# Patient Record
Sex: Male | Born: 1980 | Race: White | Hispanic: No | Marital: Married | Smoking: Never smoker
Health system: Southern US, Community
[De-identification: ages and names within clinical notes are randomized; demographics above are authoritative.]

---

## 2019-12-22 ENCOUNTER — Ambulatory Visit: Payer: Self-pay | Attending: Internal Medicine

## 2019-12-22 DIAGNOSIS — Z23 Encounter for immunization: Secondary | ICD-10-CM

## 2019-12-22 NOTE — Progress Notes (Signed)
   Covid-19 Vaccination Clinic  Name:  Rick Lawson    MRN: [id number] DOB: April 25, 1981  12/22/2019  [name] was observed post Covid-19 immunization for 15 minutes without incident. He was provided with Vaccine Information Sheet and instruction to access the V-Safe system.   [name] was instructed to call 911 with any severe reactions post vaccine: Marland Kitchen Difficulty breathing  . Swelling of face and throat  . A fast heartbeat  . A bad rash all over body  . Dizziness and weakness   Immunizations Administered    Name Date Dose VIS Date Route   Pfizer COVID-19 Vaccine 12/22/2019  4:26 PM 0.3 mL 09/22/2019 Intramuscular   Manufacturer: [employer], [employer]   Lot: [id number]   NDC: [address]-5

## 2020-01-16 ENCOUNTER — Ambulatory Visit: Payer: Self-pay | Attending: Internal Medicine

## 2020-01-16 DIAGNOSIS — Z23 Encounter for immunization: Secondary | ICD-10-CM

## 2020-01-16 NOTE — Progress Notes (Signed)
   Covid-19 Vaccination Clinic  Name:  Rick Lawson    MRN: [id number] DOB: 09-15-1981  01/16/2020  [name] was observed post Covid-19 immunization for 15 minutes without incident. He was provided with Vaccine Information Sheet and instruction to access the V-Safe system.   [name] was instructed to call 911 with any severe reactions post vaccine: Marland Kitchen Difficulty breathing  . Swelling of face and throat  . A fast heartbeat  . A bad rash all over body  . Dizziness and weakness   Immunizations Administered    Name Date Dose VIS Date Route   Pfizer COVID-19 Vaccine 01/16/2020  9:34 AM 0.3 mL 09/22/2019 Intramuscular   Manufacturer: [employer], [employer]   Lot: [id number]   NDC: [address]-0

## 2020-02-26 ENCOUNTER — Other Ambulatory Visit: Payer: Self-pay

## 2020-02-26 ENCOUNTER — Ambulatory Visit (HOSPITAL_COMMUNITY)
Admission: EM | Admit: 2020-02-26 | Discharge: 2020-02-26 | Disposition: A | Discharge status: Home / Self Care | Payer: PRIVATE HEALTH INSURANCE | Attending: Urgent Care | Admitting: Urgent Care

## 2020-02-26 ENCOUNTER — Encounter (HOSPITAL_COMMUNITY): Payer: Self-pay

## 2020-02-26 DIAGNOSIS — Z79899 Other long term (current) drug therapy: Secondary | ICD-10-CM | POA: Insufficient documentation

## 2020-02-26 DIAGNOSIS — R5383 Other fatigue: Secondary | ICD-10-CM | POA: Insufficient documentation

## 2020-02-26 DIAGNOSIS — R070 Pain in throat: Secondary | ICD-10-CM

## 2020-02-26 DIAGNOSIS — R5381 Other malaise: Secondary | ICD-10-CM | POA: Insufficient documentation

## 2020-02-26 DIAGNOSIS — R000 Tachycardia, unspecified: Secondary | ICD-10-CM | POA: Diagnosis not present

## 2020-02-26 DIAGNOSIS — B349 Viral infection, unspecified: Secondary | ICD-10-CM

## 2020-02-26 DIAGNOSIS — Z20822 Contact with and (suspected) exposure to covid-19: Secondary | ICD-10-CM | POA: Insufficient documentation

## 2020-02-26 DIAGNOSIS — R05 Cough: Secondary | ICD-10-CM | POA: Diagnosis not present

## 2020-02-26 DIAGNOSIS — R059 Cough, unspecified: Secondary | ICD-10-CM

## 2020-02-26 DIAGNOSIS — R509 Fever, unspecified: Secondary | ICD-10-CM | POA: Insufficient documentation

## 2020-02-26 LAB — POCT RAPID STREP A: Streptococcus, Group A Screen (Direct): NEGATIVE

## 2020-02-26 MED ORDER — PROMETHAZINE-DM 6.25-15 MG/5ML PO SYRP
5.0000 mL | ORAL_SOLUTION | Freq: Every evening | ORAL | 0 refills | Status: DC | PRN
Start: 2020-02-26 — End: 2022-03-06

## 2020-02-26 MED ORDER — CETIRIZINE HCL 10 MG PO TABS: 10.0000 mg | ORAL_TABLET | Freq: Every day | ORAL | 0 refills | Status: DC

## 2020-02-26 MED ORDER — ACETAMINOPHEN 325 MG PO TABS
650.0000 mg | ORAL_TABLET | Freq: Once | ORAL | Status: AC
  Administered 2020-02-26: 650 mg via ORAL

## 2020-02-26 MED ORDER — BENZONATATE 100 MG PO CAPS: 100.0000 mg | ORAL_CAPSULE | Freq: Three times a day (TID) | ORAL | 0 refills | Status: DC | PRN

## 2020-02-26 MED ORDER — ACETAMINOPHEN 325 MG PO TABS
ORAL_TABLET | ORAL | Status: AC
  Filled 2020-02-26: qty 2

## 2020-02-26 NOTE — Discharge Instructions (Signed)

## 2020-02-26 NOTE — ED Triage Notes (Signed)
Pt c/o nasal congestion, sore throat,  onset Friday, fever onset yesterday with Tmax 100.5 (orally). Last dose dayquil early this morning.  Single episode of diarrhea yesterday.  Denies abdom pain, n/v.   Had second COVID vaccine in April

## 2020-02-26 NOTE — ED Provider Notes (Addendum)
[place]   MRN: [id number] DOB: 1981-04-07  Subjective:   Rick Lawson is a 39 y.o. male presenting for 3-day history of cute onset and worsening malaise.  Worse symptom is throat pain.  But he is also had stuffy nose, cough and occasional intermittent shortness of breath, fatigue and fever.  He had a sick contact with his son with very similar symptoms that got better.  Has used DayQuil and Tylenol with minimal relief.  Patient completed Covid vaccination last month.  No current facility-administered medications for this encounter.  Current Outpatient Medications:  .  omeprazole (PRILOSEC) 20 MG capsule, Take by mouth., Disp: , Rfl:  .  cetirizine (ZYRTEC) 10 MG tablet, Take by mouth., Disp: , Rfl:  .  fluticasone (FLONASE) 50 MCG/ACT nasal spray, Place into the nose., Disp: , Rfl:    No Known Allergies  History reviewed. No pertinent past medical history.   History reviewed. No pertinent surgical history.  Family History  Problem Relation Age of Onset  . Healthy Mother   . Healthy Father     Social History   Tobacco Use  . Smoking status: Never Smoker  . Smokeless tobacco: Never Used  Substance Use Topics  . Alcohol use: Yes  . Drug use: Never    ROS   Objective:   Vitals: BP 113/85 (BP Location: Left Arm)   Pulse (!) 122   Temp 100.3 F (37.9 C) (Oral)   Resp 20   SpO2 98%   Pulse remained 120-124 on recheck by PA-Dontarious Schaum.   Physical Exam Constitutional:      General: He is not in acute distress.    Appearance: Normal appearance. He is well-developed. He is not ill-appearing, toxic-appearing or diaphoretic.  HENT:     Head: Normocephalic and atraumatic.     Right Ear: External ear normal.     Left Ear: External ear normal.     Nose: Nose normal.     Mouth/Throat:     Mouth: Mucous membranes are moist.     Pharynx: Oropharynx is clear. Posterior oropharyngeal erythema and uvula swelling present.  Eyes:     General: No scleral  icterus.    Extraocular Movements: Extraocular movements intact.     Pupils: Pupils are equal, round, and reactive to light.  Cardiovascular:     Rate and Rhythm: Regular rhythm. Tachycardia present.     Heart sounds: Normal heart sounds. No murmur. No friction rub. No gallop.   Pulmonary:     Effort: Pulmonary effort is normal. No respiratory distress.     Breath sounds: Normal breath sounds. No stridor. No wheezing, rhonchi or rales.  Neurological:     Mental Status: He is alert and oriented to person, place, and time.  Psychiatric:        Mood and Affect: Mood normal.        Behavior: Behavior normal.        Thought Content: Thought content normal.        Judgment: Judgment normal.     Results for orders placed or performed during the hospital encounter of 02/26/20 (from the past 24 hour(s))  POCT rapid strep A [facility])     Status: None   Collection Time: 02/26/20  7:47 PM  Result Value Ref Range   Streptococcus, Group A Screen (Direct) NEGATIVE NEGATIVE   ED ECG REPORT   Date: 02/26/2020  Rate: 116bpm  Rhythm: sinus tachycardia  QRS Axis: normal  Intervals: normal  ST/T  Wave abnormalities: nonspecific T wave changes  Conduction Disutrbances:right bundle branch block  Narrative Interpretation: Sinus tachycardia at 116 bpm, possible incomplete right bundle branch block, T wave inversion in lead III.  No previous EKG for comparison.  Old EKG Reviewed: none available  I have personally reviewed the EKG tracing and agree with the computerized printout as noted.   Assessment and Plan :   PDMP not reviewed this encounter.  1. Fever, unspecified   2. Throat pain   3. Cough   4. Malaise and fatigue     Suspect tachycardia is related to fever but have a high suspicion for COVID-19 given symptoms set.  Will manage for viral illness such as viral URI, viral syndrome, viral rhinitis, viral pharyngitis, COVID-19. Counseled patient on nature of COVID-19 including modes  of transmission, diagnostic testing, management and supportive care.  Offered symptomatic relief. COVID 19 testing is pending. Counseled patient on potential for adverse effects with medications prescribed/recommended today, strict ER and return-to-clinic precautions discussed, patient verbalized understanding.      Wallis Bamberg, PA-C 02/26/20 2013

## 2020-02-27 LAB — SARS CORONAVIRUS 2 (TAT 6-24 HRS): SARS Coronavirus 2: NEGATIVE

## 2020-02-28 LAB — CULTURE, GROUP A STREP (THRC)

## 2022-03-06 ENCOUNTER — Other Ambulatory Visit: Payer: Self-pay

## 2022-03-06 ENCOUNTER — Emergency Department (HOSPITAL_BASED_OUTPATIENT_CLINIC_OR_DEPARTMENT_OTHER)
Admission: EM | Admit: 2022-03-06 | Discharge: 2022-03-06 | Disposition: A | Discharge status: Home / Self Care | Payer: 59 | Attending: Emergency Medicine | Admitting: Emergency Medicine

## 2022-03-06 ENCOUNTER — Emergency Department (HOSPITAL_BASED_OUTPATIENT_CLINIC_OR_DEPARTMENT_OTHER): Payer: 59

## 2022-03-06 ENCOUNTER — Encounter (HOSPITAL_BASED_OUTPATIENT_CLINIC_OR_DEPARTMENT_OTHER): Payer: Self-pay

## 2022-03-06 DIAGNOSIS — N201 Calculus of ureter: Secondary | ICD-10-CM | POA: Insufficient documentation

## 2022-03-06 DIAGNOSIS — R1032 Left lower quadrant pain: Secondary | ICD-10-CM | POA: Diagnosis present

## 2022-03-06 LAB — CBC WITH DIFFERENTIAL/PLATELET
Abs Immature Granulocytes: 0.02 10*3/uL (ref 0.00–0.07)
Basophils Absolute: 0 10*3/uL (ref 0.0–0.1)
Basophils Relative: 1 %
Eosinophils Absolute: 0.2 10*3/uL (ref 0.0–0.5)
Eosinophils Relative: 3 %
HCT: 42.8 % (ref 39.0–52.0)
Hemoglobin: 15.1 g/dL (ref 13.0–17.0)
Immature Granulocytes: 0 %
Lymphocytes Relative: 48 %
Lymphs Abs: 3.4 10*3/uL (ref 0.7–4.0)
MCH: 31.3 pg (ref 26.0–34.0)
MCHC: 35.3 g/dL (ref 30.0–36.0)
MCV: 88.8 fL (ref 80.0–100.0)
Monocytes Absolute: 0.6 10*3/uL (ref 0.1–1.0)
Monocytes Relative: 8 %
Neutro Abs: 2.8 10*3/uL (ref 1.7–7.7)
Neutrophils Relative %: 40 %
Platelets: 212 10*3/uL (ref 150–400)
RBC: 4.82 MIL/uL (ref 4.22–5.81)
RDW: 12.3 % (ref 11.5–15.5)
WBC: 7.1 10*3/uL (ref 4.0–10.5)
nRBC: 0 % (ref 0.0–0.2)

## 2022-03-06 LAB — URINALYSIS, ROUTINE W REFLEX MICROSCOPIC
Bilirubin Urine: NEGATIVE
Glucose, UA: NEGATIVE mg/dL
Hgb urine dipstick: NEGATIVE
Ketones, ur: NEGATIVE mg/dL
Leukocytes,Ua: NEGATIVE
Nitrite: NEGATIVE
Protein, ur: 30 mg/dL — AB
Specific Gravity, Urine: 1.031 — ABNORMAL HIGH (ref 1.005–1.030)
pH: 5 (ref 5.0–8.0)

## 2022-03-06 LAB — BASIC METABOLIC PANEL
Anion gap: 8 (ref 5–15)
BUN: 24 mg/dL — ABNORMAL HIGH (ref 6–20)
CO2: 29 mmol/L (ref 22–32)
Calcium: 9.1 mg/dL (ref 8.9–10.3)
Chloride: 105 mmol/L (ref 98–111)
Creatinine, Ser: 1.17 mg/dL (ref 0.61–1.24)
GFR, Estimated: >60 mL/min (ref >60)
Glucose, Bld: 116 mg/dL — ABNORMAL HIGH (ref 70–99)
Potassium: 3.8 mmol/L (ref 3.5–5.1)
Sodium: 142 mmol/L (ref 135–145)

## 2022-03-06 MED ORDER — ONDANSETRON HCL 4 MG/2ML IJ SOLN
4.0000 mg | Freq: Once | INTRAMUSCULAR | Status: AC
  Administered 2022-03-06: 4 mg via INTRAVENOUS
  Filled 2022-03-06: qty 2

## 2022-03-06 MED ORDER — FENTANYL CITRATE PF 50 MCG/ML IJ SOSY
50.0000 ug | PREFILLED_SYRINGE | Freq: Once | INTRAMUSCULAR | Status: AC
  Administered 2022-03-06: 50 ug via INTRAVENOUS
  Filled 2022-03-06: qty 1

## 2022-03-06 MED ORDER — KETOROLAC TROMETHAMINE 30 MG/ML IJ SOLN
30.0000 mg | Freq: Once | INTRAMUSCULAR | Status: AC
  Administered 2022-03-06: 30 mg via INTRAVENOUS
  Filled 2022-03-06: qty 1

## 2022-03-06 MED ORDER — TAMSULOSIN HCL 0.4 MG PO CAPS: 0.4000 mg | ORAL_CAPSULE | Freq: Every day | ORAL | 0 refills | Status: AC

## 2022-03-06 MED ORDER — SODIUM CHLORIDE 0.9 % IV BOLUS
1000.0000 mL | Freq: Once | INTRAVENOUS | Status: AC
  Administered 2022-03-06: 1000 mL via INTRAVENOUS

## 2022-03-06 MED ORDER — IBUPROFEN 800 MG PO TABS: 800.0000 mg | ORAL_TABLET | Freq: Three times a day (TID) | ORAL | 0 refills | Status: AC | PRN

## 2022-03-06 NOTE — ED Triage Notes (Signed)
Reports left flank pain that has moved to the groin area beginning about 1hr prior to arrival. Urinated and had a tingling sensation with pressure afterwards.

## 2022-03-06 NOTE — ED Provider Notes (Signed)
[facility] EMERGENCY DEPT  Provider Note  CSN: [id number] Arrival date & time: 03/06/22 0117  History Chief Complaint  Patient presents with   Groin Pain    Rick Lawson is a 41 y.o. male reports he was woken up from sleep about an hour prior to arrival with a pain in his urethra, he went to urinate but pain did not go away. He began having a dull aching pain in his L groin area. Some in L flank. Not associated with nausea or diaphoresis. No blood in urine. No fevers. No prior history of same.    Home Medications Prior to Admission medications   Medication Sig Start Date End Date Taking? Authorizing Provider  ibuprofen (ADVIL) 800 MG tablet Take 1 tablet (800 mg total) by mouth every 8 (eight) hours as needed. 03/06/22  Yes Pollyann Savoy, MD  tamsulosin (FLOMAX) 0.4 MG CAPS capsule Take 1 capsule (0.4 mg total) by mouth daily for 14 days. 03/06/22 03/20/22 Yes Pollyann Savoy, MD     Allergies    Dog epithelium allergy skin test, Grass pollen([id number]-o-r-t-swt vern), Other, and Pollen extract   Review of Systems   Review of Systems Please see HPI for pertinent positives and negatives  Physical Exam BP 138/80   Pulse 76   Temp 98 F (36.7 C)   Resp 19   Ht 5\' 10"  (1.778 m)   Wt 79.4 kg   SpO2 100%   BMI 25.11 kg/m   Physical Exam Vitals and nursing note reviewed.  Constitutional:      Appearance: Normal appearance.  HENT:     Head: Normocephalic and atraumatic.     Nose: Nose normal.     Mouth/Throat:     Mouth: Mucous membranes are moist.  Eyes:     Extraocular Movements: Extraocular movements intact.     Conjunctiva/sclera: Conjunctivae normal.  Cardiovascular:     Rate and Rhythm: Normal rate.  Pulmonary:     Effort: Pulmonary effort is normal.     Breath sounds: Normal breath sounds.  Abdominal:     General: Abdomen is flat.     Palpations: Abdomen is soft.     Tenderness: There is abdominal tenderness (LLQ). There is no  guarding.  Musculoskeletal:        General: No swelling. Normal range of motion.     Cervical back: Neck supple.  Skin:    General: Skin is warm and dry.  Neurological:     General: No focal deficit present.     Mental Status: He is alert.  Psychiatric:        Mood and Affect: Mood normal.    ED Results / Procedures / Treatments   EKG None  Procedures Procedures  Medications Ordered in the ED Medications  fentaNYL (SUBLIMAZE) injection 50 mcg (50 mcg Intravenous Given 03/06/22 0150)  ondansetron (ZOFRAN) injection 4 mg (4 mg Intravenous Given 03/06/22 0150)  sodium chloride 0.9 % bolus 1,000 mL (1,000 mLs Intravenous New Bag/Given 03/06/22 0151)  ketorolac (TORADOL) 30 MG/ML injection 30 mg (30 mg Intravenous Given 03/06/22 0234)    Initial Impression and Plan  Patient here with LLQ/L groin pain radiating into urethra, most likely a renal stone. Could also be diverticulitis, UTI, pyelo, prostatitis. Will check labs, send for CT. Pain/nausea meds and fluids for comfort.   ED Course   Clinical Course as of 03/06/22 0327  Fri Mar 06, 2022  0144 UA is unremarkable.  [CS]  0205 CBC is  normal.  [CS]  0219 I personally viewed the images from radiology studies and agree with radiologist interpretation: CT shows a small distal L ureteral stone as the cause of his symptoms.   [CS]  0226 Discussed CT results with patient and wife now at bedside. He is still having significant pain. Will give a dose of toradol and reassess while waiting for BMP.  [CS]  0228 BMP is unremarkable.  [CS]  [id number] Patient reports pain has improved, he is resting comfortably and ready to go home. Plan discharge with Rx for Motrin and Flomax, expect that he will pass the stone without issue. RTED for any other concerns. Discussed dietary changes to prevent future stones.  [CS]    Clinical Course User Index [CS] Pollyann Savoy, MD     MDM Rules/Calculators/A&P Medical Decision Making Problems  Addressed: Ureteral stone: acute illness or injury  Amount and/or Complexity of Data Reviewed Labs: ordered. Decision-making details documented in ED Course. Radiology: ordered and independent interpretation performed. Decision-making details documented in ED Course.  Risk Prescription drug management. Parenteral controlled substances.    Final Clinical Impression(s) / ED Diagnoses Final diagnoses:  Ureteral stone    Rx / DC Orders ED Discharge Orders          Ordered    ibuprofen (ADVIL) 800 MG tablet  Every 8 hours PRN        03/06/22 0327    tamsulosin (FLOMAX) 0.4 MG CAPS capsule  Daily        03/06/22 0327             Pollyann Savoy, MD 03/06/22 [phone]

## 2022-07-21 IMAGING — CT CT RENAL STONE PROTOCOL
2 of 4 series · 16 of 46 positions shown, 18 images · non-contrast
Comparison: None Available.

CLINICAL DATA: Left flank pain



[Series 2: stone full · axial · 0.75mm/px · z∈[-247,+233]mm · 13 of 106 slices shown, 15 images]
[im 5/106  soft-tissue]
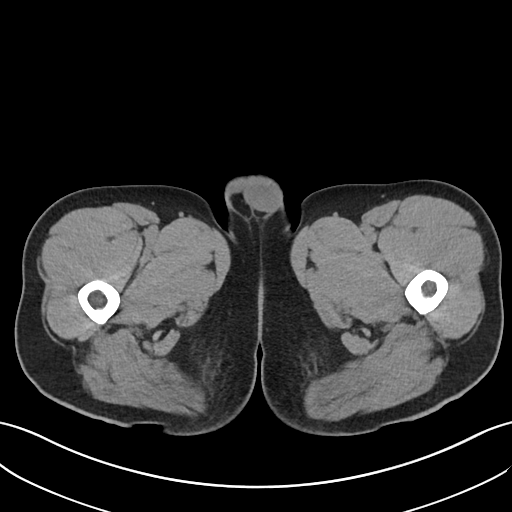
[im 5/106  bone]
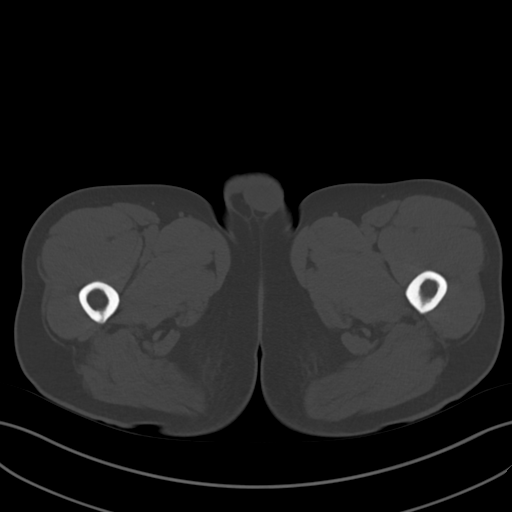
[im 13/106  soft-tissue]
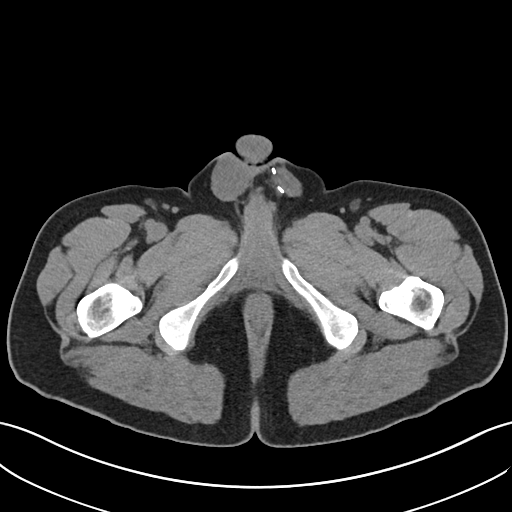
[im 22/106  soft-tissue]
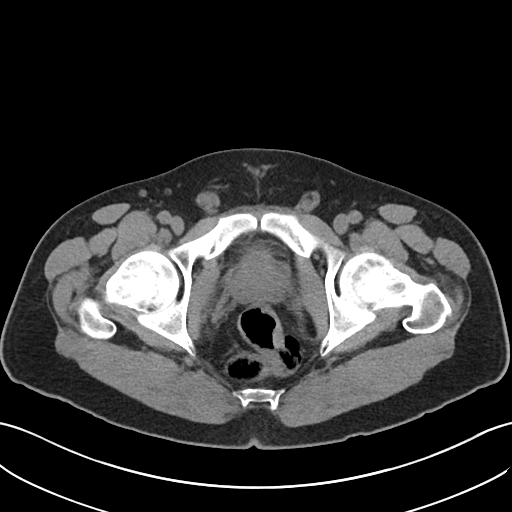
[im 30/106  soft-tissue]
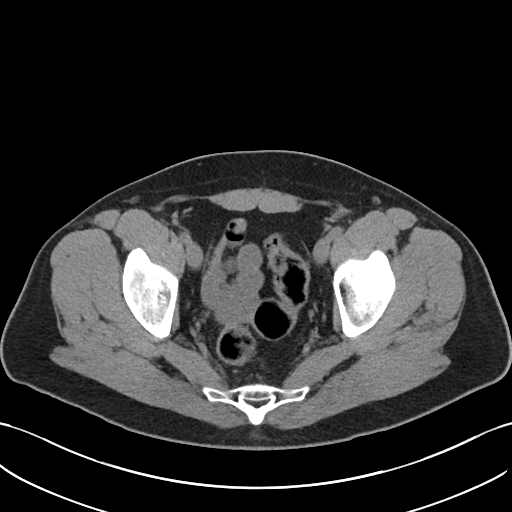
[im 38/106  soft-tissue]
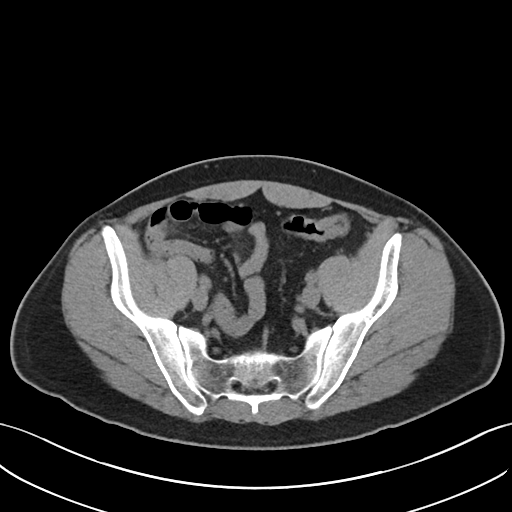
[im 47/106  soft-tissue]
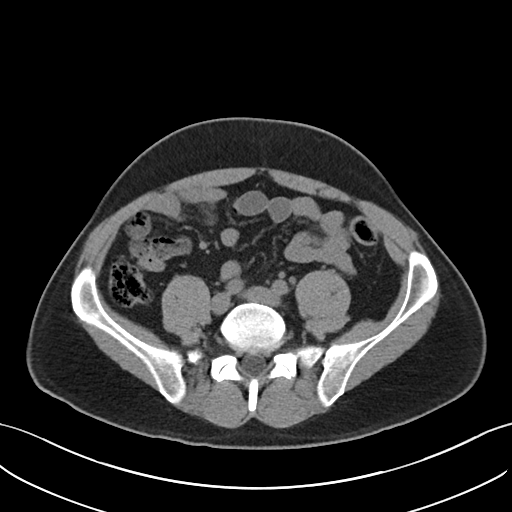
[im 55/106  soft-tissue]
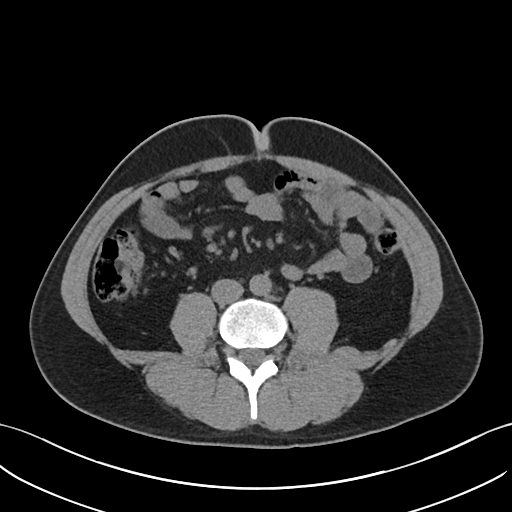
[im 59/106  soft-tissue]
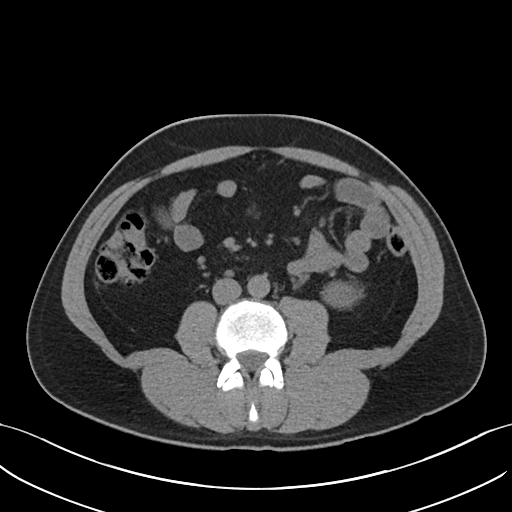
[im 68/106  soft-tissue]
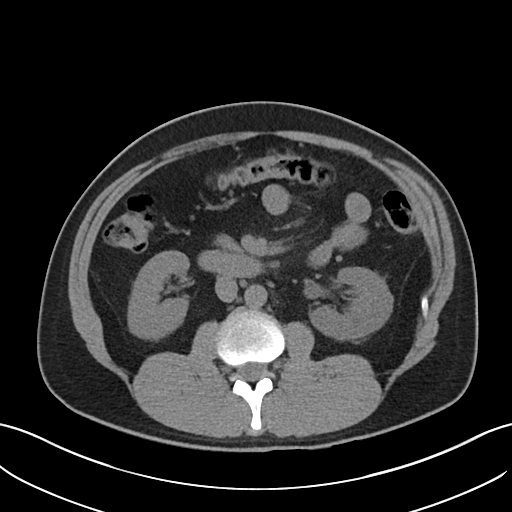
[im 68/106  bone]
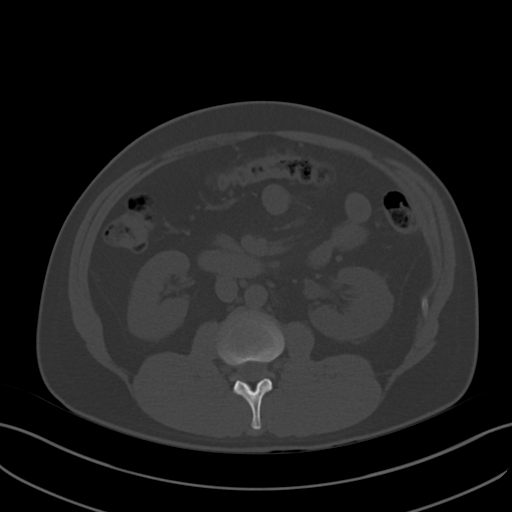
[im 76/106  soft-tissue]
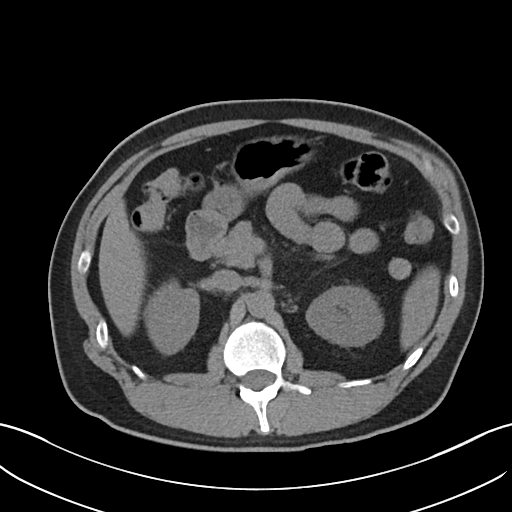
[im 85/106  soft-tissue]
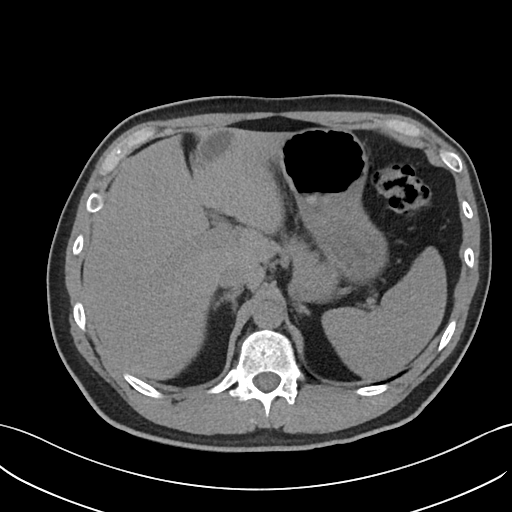
[im 93/106  soft-tissue]
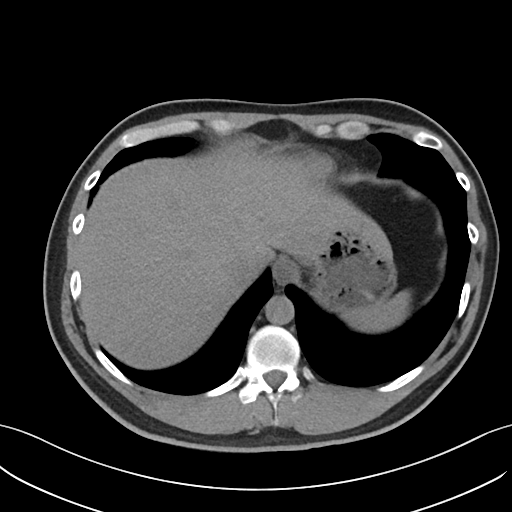
[im 101/106  soft-tissue]
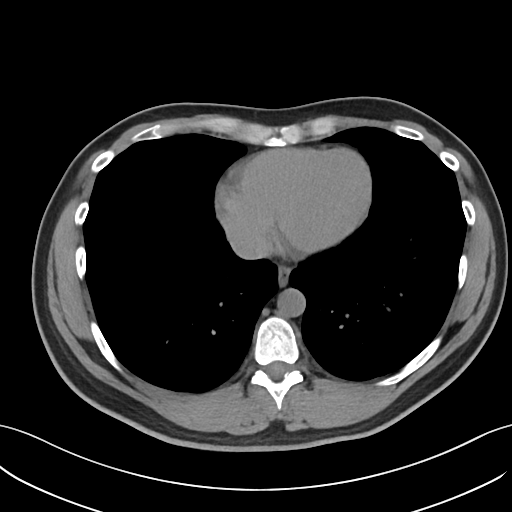

[Series 5: coronal · coronal · 0.76mm/px · 3 of 91 slices shown]
[im 31/91  soft-tissue]
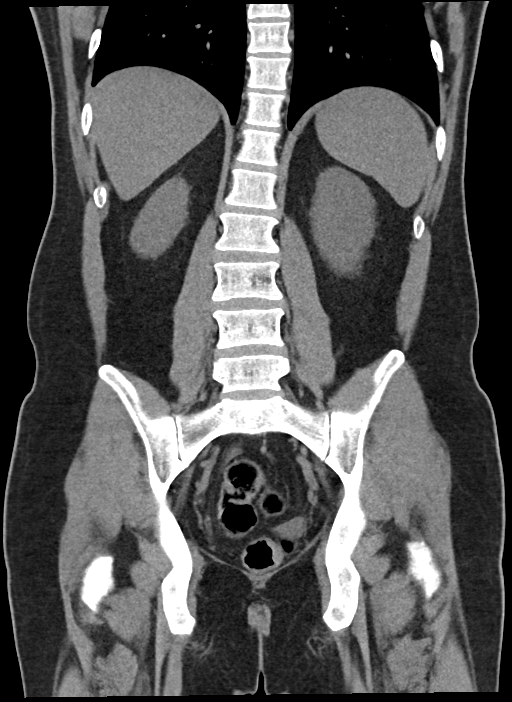
[im 41/91  soft-tissue]
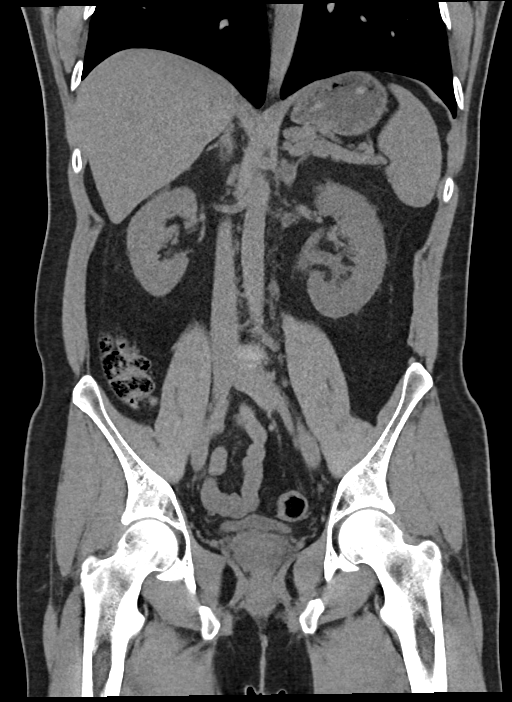
[im 51/91  soft-tissue]
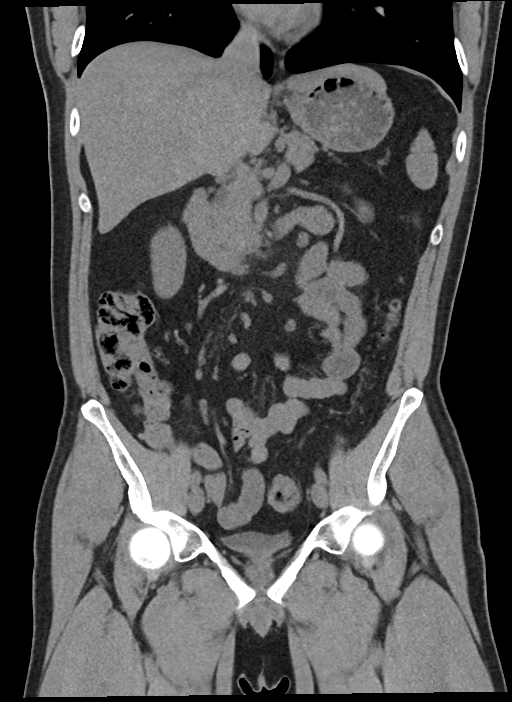

[16 of 46 positions shown; findings below may reference images not displayed]

FINDINGS: Lower chest: No acute abnormality

Hepatobiliary: Numerous gallstones within the gallbladder. No
biliary ductal dilatation or focal hepatic abnormality.

Pancreas: No focal abnormality or ductal dilatation.

Spleen: No focal abnormality.  Normal size.

Adrenals/Urinary Tract: Punctate 1 mm left UVJ stone. No
hydronephrosis. Slight fullness of the left renal collecting system
and ureter relative to the right. No renal or adrenal mass. Urinary
bladder unremarkable.

Stomach/Bowel: Normal appendix. Stomach, large and small bowel
grossly unremarkable.

Vascular/Lymphatic: No evidence of aneurysm or adenopathy.

Reproductive: No visible focal abnormality.

Other: No free fluid or free air.

Musculoskeletal: No acute bony abnormality.
IMPRESSION: Punctate 1 mm left UVJ stone with slight fullness of the left renal
collecting system and ureter but no frank hydronephrosis.

Cholelithiasis with numerous gallstones in the gallbladder.

## 2023-12-17 ENCOUNTER — Other Ambulatory Visit: Payer: Self-pay | Admitting: Urology

## 2023-12-17 DIAGNOSIS — D49512 Neoplasm of unspecified behavior of left kidney: Secondary | ICD-10-CM

## 2024-01-16 ENCOUNTER — Ambulatory Visit
Admission: RE | Admit: 2024-01-16 | Discharge: 2024-01-16 | Disposition: A | Discharge status: Home / Self Care | Source: Ambulatory Visit | Attending: Urology

## 2024-01-16 DIAGNOSIS — D49512 Neoplasm of unspecified behavior of left kidney: Secondary | ICD-10-CM

## 2024-01-16 MED ORDER — GADOPICLENOL 0.5 MMOL/ML IV SOLN
8.0000 mL | Freq: Once | INTRAVENOUS | Status: AC | PRN
  Administered 2024-01-16: 8 mL via INTRAVENOUS
